# Patient Record
Sex: Male | Born: 1975 | Race: White | Hispanic: No | Smoking: Current every day smoker
Health system: Southern US, Community
[De-identification: ages and names within clinical notes are randomized; demographics above are authoritative.]

---

## 2015-01-28 ENCOUNTER — Emergency Department (HOSPITAL_COMMUNITY): Payer: Medicaid Other

## 2015-01-28 ENCOUNTER — Emergency Department (HOSPITAL_COMMUNITY)
Admission: EM | Admit: 2015-01-28 | Discharge: 2015-01-29 | Disposition: A | Discharge status: Home / Self Care | Payer: Medicaid Other | Attending: Emergency Medicine | Admitting: Emergency Medicine

## 2015-01-28 ENCOUNTER — Encounter (HOSPITAL_COMMUNITY): Payer: Self-pay | Admitting: Family Medicine

## 2015-01-28 DIAGNOSIS — F17200 Nicotine dependence, unspecified, uncomplicated: Secondary | ICD-10-CM | POA: Diagnosis not present

## 2015-01-28 DIAGNOSIS — R112 Nausea with vomiting, unspecified: Secondary | ICD-10-CM | POA: Insufficient documentation

## 2015-01-28 DIAGNOSIS — R1011 Right upper quadrant pain: Secondary | ICD-10-CM | POA: Diagnosis not present

## 2015-01-28 DIAGNOSIS — R1013 Epigastric pain: Secondary | ICD-10-CM | POA: Insufficient documentation

## 2015-01-28 DIAGNOSIS — K219 Gastro-esophageal reflux disease without esophagitis: Secondary | ICD-10-CM | POA: Insufficient documentation

## 2015-01-28 DIAGNOSIS — R079 Chest pain, unspecified: Secondary | ICD-10-CM

## 2015-01-28 LAB — URINE MICROSCOPIC-ADD ON

## 2015-01-28 LAB — URINALYSIS, ROUTINE W REFLEX MICROSCOPIC
BILIRUBIN URINE: NEGATIVE
Glucose, UA: NEGATIVE mg/dL
KETONES UR: NEGATIVE mg/dL
NITRITE: NEGATIVE
PH: 6.5 (ref 5.0–8.0)
PROTEIN: NEGATIVE mg/dL
Specific Gravity, Urine: 1.012 (ref 1.005–1.030)

## 2015-01-28 LAB — COMPREHENSIVE METABOLIC PANEL
ALBUMIN: 3.5 g/dL (ref 3.5–5.0)
ALK PHOS: 103 U/L (ref 38–126)
ALT: 66 U/L — AB (ref 17–63)
AST: 47 U/L — AB (ref 15–41)
Anion gap: 10 (ref 5–15)
BILIRUBIN TOTAL: 0.8 mg/dL (ref 0.3–1.2)
BUN: 8 mg/dL (ref 6–20)
CALCIUM: 9.6 mg/dL (ref 8.9–10.3)
CO2: 24 mmol/L (ref 22–32)
CREATININE: 0.81 mg/dL (ref 0.61–1.24)
Chloride: 107 mmol/L (ref 101–111)
GFR calc Af Amer: >60 mL/min (ref >60)
GLUCOSE: 109 mg/dL — AB (ref 65–99)
POTASSIUM: 4 mmol/L (ref 3.5–5.1)
Sodium: 141 mmol/L (ref 135–145)
TOTAL PROTEIN: 6.7 g/dL (ref 6.5–8.1)

## 2015-01-28 LAB — CBC
HEMATOCRIT: 45.2 % (ref 39.0–52.0)
Hemoglobin: 15.2 g/dL (ref 13.0–17.0)
MCH: 29 pg (ref 26.0–34.0)
MCHC: 33.6 g/dL (ref 30.0–36.0)
MCV: 86.3 fL (ref 78.0–100.0)
PLATELETS: 248 10*3/uL (ref 150–400)
RBC: 5.24 MIL/uL (ref 4.22–5.81)
RDW: 13.7 % (ref 11.5–15.5)
WBC: 10 10*3/uL (ref 4.0–10.5)

## 2015-01-28 LAB — I-STAT TROPONIN, ED: Troponin i, poc: 0 ng/mL (ref 0.00–0.08)

## 2015-01-28 LAB — LIPASE, BLOOD: Lipase: 21 U/L (ref 11–51)

## 2015-01-28 MED ORDER — PANTOPRAZOLE SODIUM 40 MG IV SOLR
40.0000 mg | Freq: Once | INTRAVENOUS | Status: AC
  Administered 2015-01-28: 40 mg via INTRAVENOUS
  Filled 2015-01-28: qty 40

## 2015-01-28 MED ORDER — ONDANSETRON HCL 4 MG/2ML IJ SOLN
4.0000 mg | Freq: Once | INTRAMUSCULAR | Status: AC
  Administered 2015-01-28: 4 mg via INTRAVENOUS
  Filled 2015-01-28: qty 2

## 2015-01-28 MED ORDER — ONDANSETRON 4 MG PO TBDP
ORAL_TABLET | ORAL | Status: AC
  Administered 2015-01-28: 4 mg via ORAL
  Filled 2015-01-28: qty 1

## 2015-01-28 MED ORDER — ONDANSETRON 4 MG PO TBDP
4.0000 mg | ORAL_TABLET | Freq: Once | ORAL | Status: AC | PRN
  Administered 2015-01-28: 4 mg via ORAL

## 2015-01-28 MED ORDER — GI COCKTAIL ~~LOC~~
30.0000 mL | Freq: Once | ORAL | Status: AC
  Administered 2015-01-28: 30 mL via ORAL
  Filled 2015-01-28: qty 30

## 2015-01-28 NOTE — ED Provider Notes (Signed)
CSN: [id number]     Arrival date & time 01/28/15  1522 History   First MD Initiated Contact with Patient 01/28/15 2142     Chief Complaint  Patient presents with  . Chest Pain  . Emesis     (Consider location/radiation/quality/duration/timing/severity/associated sxs/prior Treatment) HPI   Patient is a 40 year old male with past medical history of GERD who presents the ED with complaint of epigastric pain, onset 3 days. Patient reports having waxing and waning sharp and dull pain to his epigastric region. He notes pain is worse with eating and he feels bloated postprandial. Patient states pain is worsened with taking a deep breath. Endorses associated nausea, vomiting. He states he took Pepto-Bismol at home without relief. Patient also notes he was seen at [facility] ED yesterday for the same symptoms, labs EKG chest x-ray and abdominal ultrasound were negative and patient was discharged home and advised to follow-up with his PCP but notes he was unable to schedule an appointment for the next week. Denies fever, chills, shortness of breath, chest pain, cough, palpitations, wheezing, hematemesis, diarrhea, urinary symptoms. Denies history of abdominal surgeries.   History reviewed. No pertinent past medical history. History reviewed. No pertinent past surgical history. History reviewed. No pertinent family history. Social History  Substance Use Topics  . Smoking status: Current Every Day Smoker  . Smokeless tobacco: None  . Alcohol Use: None    Review of Systems  Gastrointestinal: Positive for nausea, vomiting and abdominal pain.  All other systems reviewed and are negative.     Allergies  Codeine  Home Medications   Prior to Admission medications   Medication Sig Start Date End Date Taking? Authorizing Provider  pantoprazole (PROTONIX) 20 MG tablet Take 1 tablet (20 mg total) by mouth daily. 01/29/15   Barrett Henle, PA-C  sucralfate (CARAFATE) 1 GM/10ML suspension Take  10 mLs (1 g total) by mouth 4 (four) times daily -  with meals and at bedtime. 01/29/15   Satira Sark Alzena Gerber, PA-C   BP 126/92 mmHg  Pulse 77  Temp(Src) 98.3 F (36.8 C) (Oral)  Resp 14  SpO2 99% Physical Exam  Constitutional: He is oriented to person, place, and time. He appears well-developed and well-nourished.  HENT:  Head: Normocephalic and atraumatic.  Mouth/Throat: Oropharynx is clear and moist. No oropharyngeal exudate.  Eyes: Conjunctivae and EOM are normal. Right eye exhibits no discharge. Left eye exhibits no discharge. No scleral icterus.  Neck: Normal range of motion. Neck supple.  Cardiovascular: Normal rate, regular rhythm, normal heart sounds and intact distal pulses.   Pulmonary/Chest: Effort normal and breath sounds normal. No respiratory distress. He has no wheezes. He has no rales. He exhibits no tenderness.  Abdominal: Soft. Bowel sounds are normal. He exhibits no distension and no mass. There is tenderness (epigastric and RUQ). There is no rebound and no guarding.  Musculoskeletal: Normal range of motion. He exhibits no edema.  Lymphadenopathy:    He has no cervical adenopathy.  Neurological: He is alert and oriented to person, place, and time.  Skin: Skin is warm and dry.  Nursing note and vitals reviewed.   ED Course  Procedures (including critical care time) Labs Review Labs Reviewed  COMPREHENSIVE METABOLIC PANEL - Abnormal; Notable for the following:    Glucose, Bld 109 (*)    AST 47 (*)    ALT 66 (*)    All other components within normal limits  URINALYSIS, ROUTINE W REFLEX MICROSCOPIC (NOT AT [facility]) - Abnormal; Notable for  the following:    Hgb urine dipstick MODERATE (*)    Leukocytes, UA TRACE (*)    All other components within normal limits  URINE MICROSCOPIC-ADD ON - Abnormal; Notable for the following:    Squamous Epithelial / LPF 0-5 (*)    Bacteria, UA RARE (*)    All other components within normal limits  LIPASE, BLOOD  CBC  I-STAT  TROPOININ, ED  Rosezena SensorI-STAT TROPOININ, ED    Imaging Review Dg Chest 2 View  01/28/2015  CLINICAL DATA:  Chest pain. EXAM: CHEST  2 VIEW COMPARISON:  None. FINDINGS: The heart size and mediastinal contours are within normal limits. Both lungs are clear. The visualized skeletal structures are unremarkable. IMPRESSION: No active cardiopulmonary disease. Electronically Signed   By: Francene BoyersJames  Maxwell M.D.   On: 01/28/2015 16:41   I have personally reviewed and evaluated these images and lab results as part of my medical decision-making.   EKG Interpretation None      MDM   Final diagnoses:  Epigastric pain    Patient presents with epigastric pain and associated nausea/vomiting. History of GERD. [facility] and reports negative workup, diagnosis with GERD and discharged home. Patient states symptoms worsen today. VSS. Exam revealed tenderness to epigastric and right upper quadrant, no peritoneal signs. Patient given antiemetics, Protonix and GI cocktail. EKG showed sinus rhythm, troponin negative. Labs unremarkable. Chest x-ray revealed no acute abnormality. Delta troponin negative. I have a low suspicion for ACS, PE, dissection, or other acute cardiac event at this time. Patient reports his pain improved after PPI and GI cocktail. Patient able to tolerate by mouth. [facility]. I reviewed his records which showed negative chest x-ray, right upper quadrant abdominal ultrasound revealed no acute abnormality, no gallstones or wall thickening visualized, diffuse fatty infiltration within the liver. Due to patient presenting with similar symptoms in having most recent ultrasound performed yesterday do not feel that any further imaging is warranted at this time. I suspect pt's sxs are likely due to GERD. Plan to d/c pt home with PPI. Discussed diet and other nonmedical treatments with pt. advised patient to follow up with his PCP in 2 days. Patient  given GI follow-up.  Evaluation does not show pathology requring ongoing emergent intervention or admission. Pt is hemodynamically stable and mentating appropriately. Discussed findings/results and plan with patient/guardian, who agrees with plan. All questions answered. Return precautions discussed and outpatient follow up given.       Satira Sarkicole Elizabeth Garden Farms[place]adeau, [place] JerseyPA-C 01/29/15 0120  Loren Raceravid Yelverton, MD 01/31/15 [phone]

## 2015-01-28 NOTE — ED Notes (Signed)
Pt here for chest pain with vomiting. Seen at [facility] yesterday for the same. St some abd pain also.

## 2015-01-29 ENCOUNTER — Telehealth (HOSPITAL_COMMUNITY): Payer: Self-pay

## 2015-01-29 LAB — I-STAT TROPONIN, ED: TROPONIN I, POC: 0 ng/mL (ref 0.00–0.08)

## 2015-01-29 MED ORDER — PANTOPRAZOLE SODIUM 20 MG PO TBEC: 20.0000 mg | DELAYED_RELEASE_TABLET | Freq: Every day | ORAL | Status: AC

## 2015-01-29 MED ORDER — SUCRALFATE 1 GM/10ML PO SUSP: 1.0000 g | Freq: Three times a day (TID) | ORAL | Status: AC

## 2015-01-29 NOTE — Telephone Encounter (Signed)
Pt calling to see if could change Rx for Carafate suspension to Carafate in pill form.  Discussed w/Rob Dahlia ClientBrowning PA okay to change to pill form in same dosage.  Spoke w/RPh Sam @ [facility] [place] [phone] to change to pill from suspension in same dose.  Informed pt has Rx.  LVM for pt informing ok to change to pill to bring paper Rx back to pharmacy as pharmacy has new Rx.

## 2015-01-29 NOTE — ED Notes (Signed)
PO Challenge started

## 2015-01-29 NOTE — Discharge Instructions (Signed)
Take your medications as prescribed. Follow-up with your primary care provider in 2 days. I also recommend following up with gastroenterology within the next week for follow-up. Please return to the Emergency Department if symptoms worsen or new onset of fever, difficulty breathing, chest pain, abdominal pain, vomiting, diarrhea, blood in vomit or stool.    Emergency Department Resource Guide 1) Find a Doctor and Pay Out of Pocket Although you won't have to find out who is covered by your insurance plan, it is a good idea to ask around and get recommendations. You will then need to call the office and see if the doctor you have chosen will accept you as a new patient and what types of options they offer for patients who are self-pay. Some doctors offer discounts or will set up payment plans for their patients who do not have insurance, but you will need to ask so you aren't surprised when you get to your appointment.  2) Contact Your Local Health Department Not all health departments have doctors that can see patients for sick visits, but many do, so it is worth a call to see if yours does. If you don't know where your local health department is, you can check in your phone book. The CDC also has a tool to help you locate your state's health department, and many state websites also have listings of all of their local health departments.  3) Find a Walk-in Clinic If your illness is not likely to be very severe or complicated, you may want to try a walk in clinic. These are popping up all over the country in pharmacies, drugstores, and shopping centers. They're usually staffed by nurse practitioners or physician assistants that have been trained to treat common illnesses and complaints. They're usually fairly quick and inexpensive. However, if you have serious medical issues or chronic medical problems, these are probably not your best option.  No Primary Care Doctor: - Call Health Connect at  [phone]  - they can help you locate a primary care doctor that  accepts your insurance, provides certain services, etc. - Physician Referral Service- [phone]  Chronic Pain Problems: Organization         Address  Phone   Notes  [facility]  [phone] Patients need to be referred by their primary care doctor.   Medication Assistance: Organization         Address  Phone   Notes  [facility] Medication [facility] [address] [place], [place] [address] [phone] --Must be a resident of [place] -- Must have NO insurance coverage whatsoever (no Medicaid/ Medicare, etc.) -- The pt. MUST have a primary care doctor that directs their care regularly and follows them in the community   MedAssist  [phone]) [phone]   [employer]  [phone]    Agencies that provide inexpensive medical care: Organization         Address  Phone   Notes  [facility]  [phone]) [phone]   [facility]    [phone]) [phone]   [facility] [address] [place], [place] [address] [phone]   [facility] [place] [address] [place], [place] [phone]   Planned Parenthood    [phone](336) [phone]   [facility]    [phone]) [phone]   Community Health and [facility]  [address], [place] Phone:  [phone], Fax:  [phone](336) [phone]  Hours of Operation:  9 am - 6 pm, M-F.  Also accepts Medicaid/Medicare and self-pay.  [facility]  [address], [place] Phone: [phone](336) [phone], Fax: [phone]) [phone]. Hours of Operation:  8:30 am - 5:30 pm, M-F.  Also accepts Medicaid and self-pay.  [facility] [place] [address], [place] Phone: [phone]) [phone]   [facility] [address], [place], [place] [phone], Ext. [phone] Mondays & Thursdays: 7-9 AM.  First 15 patients are seen on a first come, first serve basis.     Medicaid-accepting [facility] Providers:  Organization         Address  Phone   Notes  [facility] [address], [place] [phone]) [phone] Also accepts self-pay patients.  [facility] [address], [place]  [phone]) [phone]   [facility] [address], [place] [phone]   [facility] [address] [place] [address], [place] [phone]) [phone]   Renaye RakersVeita Bland [address], [place]   [phone] Only accepts [place] Access [place] patients after they have their name applied to their card.   Self-Pay (no insurance) in [facility]:  Organization         Address  Phone   Notes  Sickle Cell Patients, [facility] [address], [place] [phone]) [phone]   [facility] [address], [place] [phone]   [facility] [place]  [address], [place] [phone]   [facility]/Dr. Osei-Bonsu  [address] [place] [address], [place] or [phone], [place] [phone] Phone number for both [place] and [place] locations is the same.  Urgent Medical and [facility] [address], [place] [phone]   [facility] [place] [address] [place] [address], [place] or [address] [phone]) [phone]   [facility] [address], [place] [phone], phone; [phone]) [phone], fax Sees patients 1st and 3rd Saturday of every month.  Must not qualify for public or private insurance (i.e. Medicaid, Medicare, [place] Health Choice, Veterans' Benefits)  Household income should be no more than 200% of the poverty level The clinic cannot treat you if you are pregnant or think you are pregnant  Sexually transmitted diseases are not treated at the clinic.    Dental Care: Organization         Address  Phone  Notes  [facility] [place]  Department of [facility] [address], [place] [phone]) [phone] Accepts children up to age 40 who are enrolled in [place]icaid or [place] Health Choice; pregnant women with a Medicaid card; and children who have applied for Medicaid or [place] Health Choice, but were declined, whose parents can pay a reduced fee at time of service.  [facility] Department of [facility] [place]  [address], [place] [phone]) [phone] Accepts children up to age 40 who are enrolled in [place]icaid or [place] Health Choice; pregnant women with a Medicaid card; and children who have applied for Medicaid or [place] Health Choice, but were declined, whose parents can pay a reduced fee at time of service.  [facility]  [address], [place] [phone] Patients are seen by appointment only. Walk-ins are not accepted. [facility] will  see patients 85 years of age and older. Monday - Tuesday (8am-5pm) Most Wednesdays (8:30-5pm) $30 per visit, cash only  [facility] Adult Dental Access PROGRAM  [address], [facility] [phone] Patients are seen by appointment only. Walk-ins are not accepted. [place] will see patients 66 years of age and older. One Wednesday Evening (Monthly: Volunteer Based).  $30 per visit, cash only  [place]  [phone] for adults; Children under age 39, call Graduate Pediatric Dentistry at [phone]. Children aged 45-14, please call [phone] to request a pediatric application.  Dental services are provided in all areas of dental care including fillings, crowns and bridges, complete and partial dentures, implants, gum treatment, root canals, and extractions. Preventive care is also provided. Treatment is provided to both adults and children. Patients are selected via a lottery and there is often a waiting list.   [facility] [address] [place]  [phone]  [email or web address]   [facility] [address] [place], [place] [phone], Ext. [phone] Second and Fourth Thursday of each month, opens at 6:30 AM; Clinic ends at 9 AM.  Patients are seen on a first-come first-served basis, and a limited number are seen during each clinic.   [facility]  [address] Hillard Danker [place], [place] [phone]   Eligibility Requirements You must have lived in [place], [place], or [place] counties for at least the last three months.   You cannot be eligible for state or federal sponsored [employer], including [employer], [place], or [employer].   You generally cannot be eligible for healthcare insurance through your employer.    How to apply: Eligibility screenings are held every Tuesday and Wednesday afternoon from 1:00 pm until 4:00 pm. You do not need an appointment for the interview!  [facility] [address], [place], [place]   [place]  [place] Department  [place]  [phone]    Behavioral Health Resources in the Community: Intensive Outpatient Programs Organization         Address  Phone  Notes  [place]. [address] [place], [place] [phone]   [facility] [address] [place], [place]   ADS: Alcohol & Drug Svcs [address], [place], [place]   [place] [address],  [place], [place] or [phone]   Substance Abuse Resources Organization         Address  Phone  Notes  Alcohol and Drug Services  [phone]   [place]  [phone]   The [place]   Chinita Pester  [phone]   Residential & Outpatient Substance Abuse Program  [phone]   Psychological Services Organization          Address  Phone  Notes  [facility] [place]  [place]  [phone]   [place] [address], [place] or [phone]    Mobile Crisis Teams Organization         Address  Phone  Notes  [facility]rapeutic [facility]atives, Mobile Crisis Care Unit  [phone]   Assertive Psychotherapeutic Services  [address] [place], [place]   [facility] [address] [place] [phone]    Self-Help/Support Groups Organization  Address  Phone             Notes  Mental Health Assoc. of [place] - variety of support groups  [phone] Call for more information  Narcotics Anonymous (NA), Caring [facility] [address], [place]  2 meetings at this location   Statisticianesidential Treatment Programs Organization         Address  Phone  Notes  [facility] [address],    [place]  [phone]   [facility]  [address], [place] [phone], [place], [place] [phone]   [facility] [address], [place] [phone] Admissions: 8am-3pm M-F  [facility] [address]    [place], [place] [phone]   [facility] [address], [place], [place] [phone]   [facility] [address]  [place], [place] [phone]   [facility] - Intensive Outpatient [address], [place], [place] [phone]   [facility] ([facility] [address]  [place], [place] [phone] or [phone]   [facility] (RTS) [address] [place], [place] [phone] Accepts Medicaid  [facility] [address]  [place] [phone] Substance Abuse/Addiction Treatment   [facility] Behavioral Health Resources Organization         Address  Phone  Notes  [facility]  [phone]) [phone]   Angie FavaJulie Brannon, PhD [address], [address] [place], [place]   [phone]) [phone] or [phone]) [phone]   [facility]   [address] [place], [place] [phone](336) [phone]   [facility] [address], [place], [place] [phone]) [phone] Insurance/Medicaid/sponsorship through [facility]  Faith and Families [address]                                    [place], [place] [phone]) [phone] Therapy/tele-psych/case  [facility] [address]   [place], [place] [phone]) [phone]    Dr. Lolly MustacheArfeen  [phone]) [phone]   [facility] [place]  [employer] [facility] 1) [address], [place] 2) [address], [place] 3)  [address], [place] [phone](336) [phone]) [phone]  [phone]) [phone]   [facility] Child Abuse Hotline [phone]) [phone] or [phone](336) [phone] (After Hours)

## 2016-09-17 IMAGING — DX DG CHEST 2V
2 series · 2 of 2 positions shown · non-contrast
Comparison: None.

CLINICAL DATA: Chest pain.

EXAM:
CHEST  2 VIEW

[chest pa]
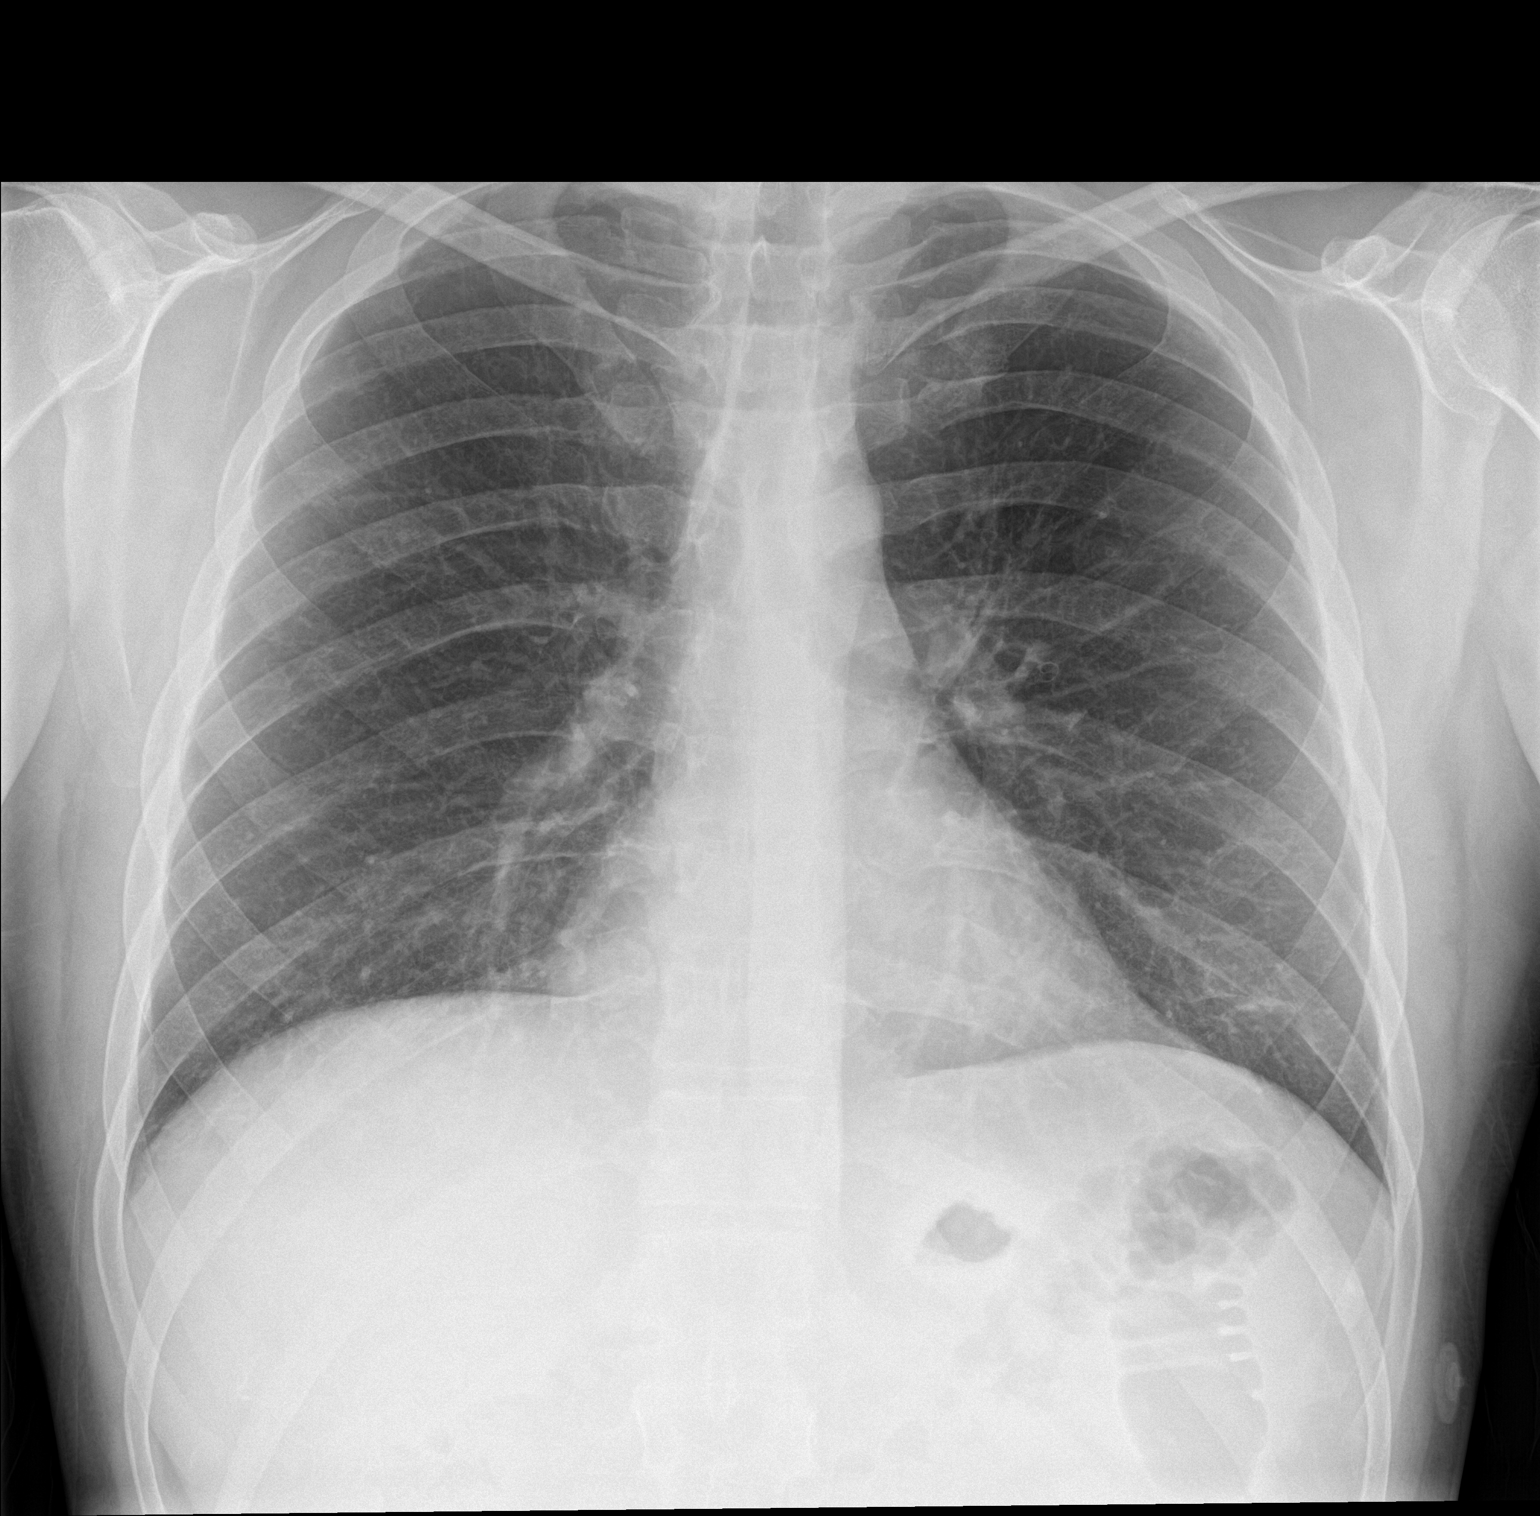

[chest lat]
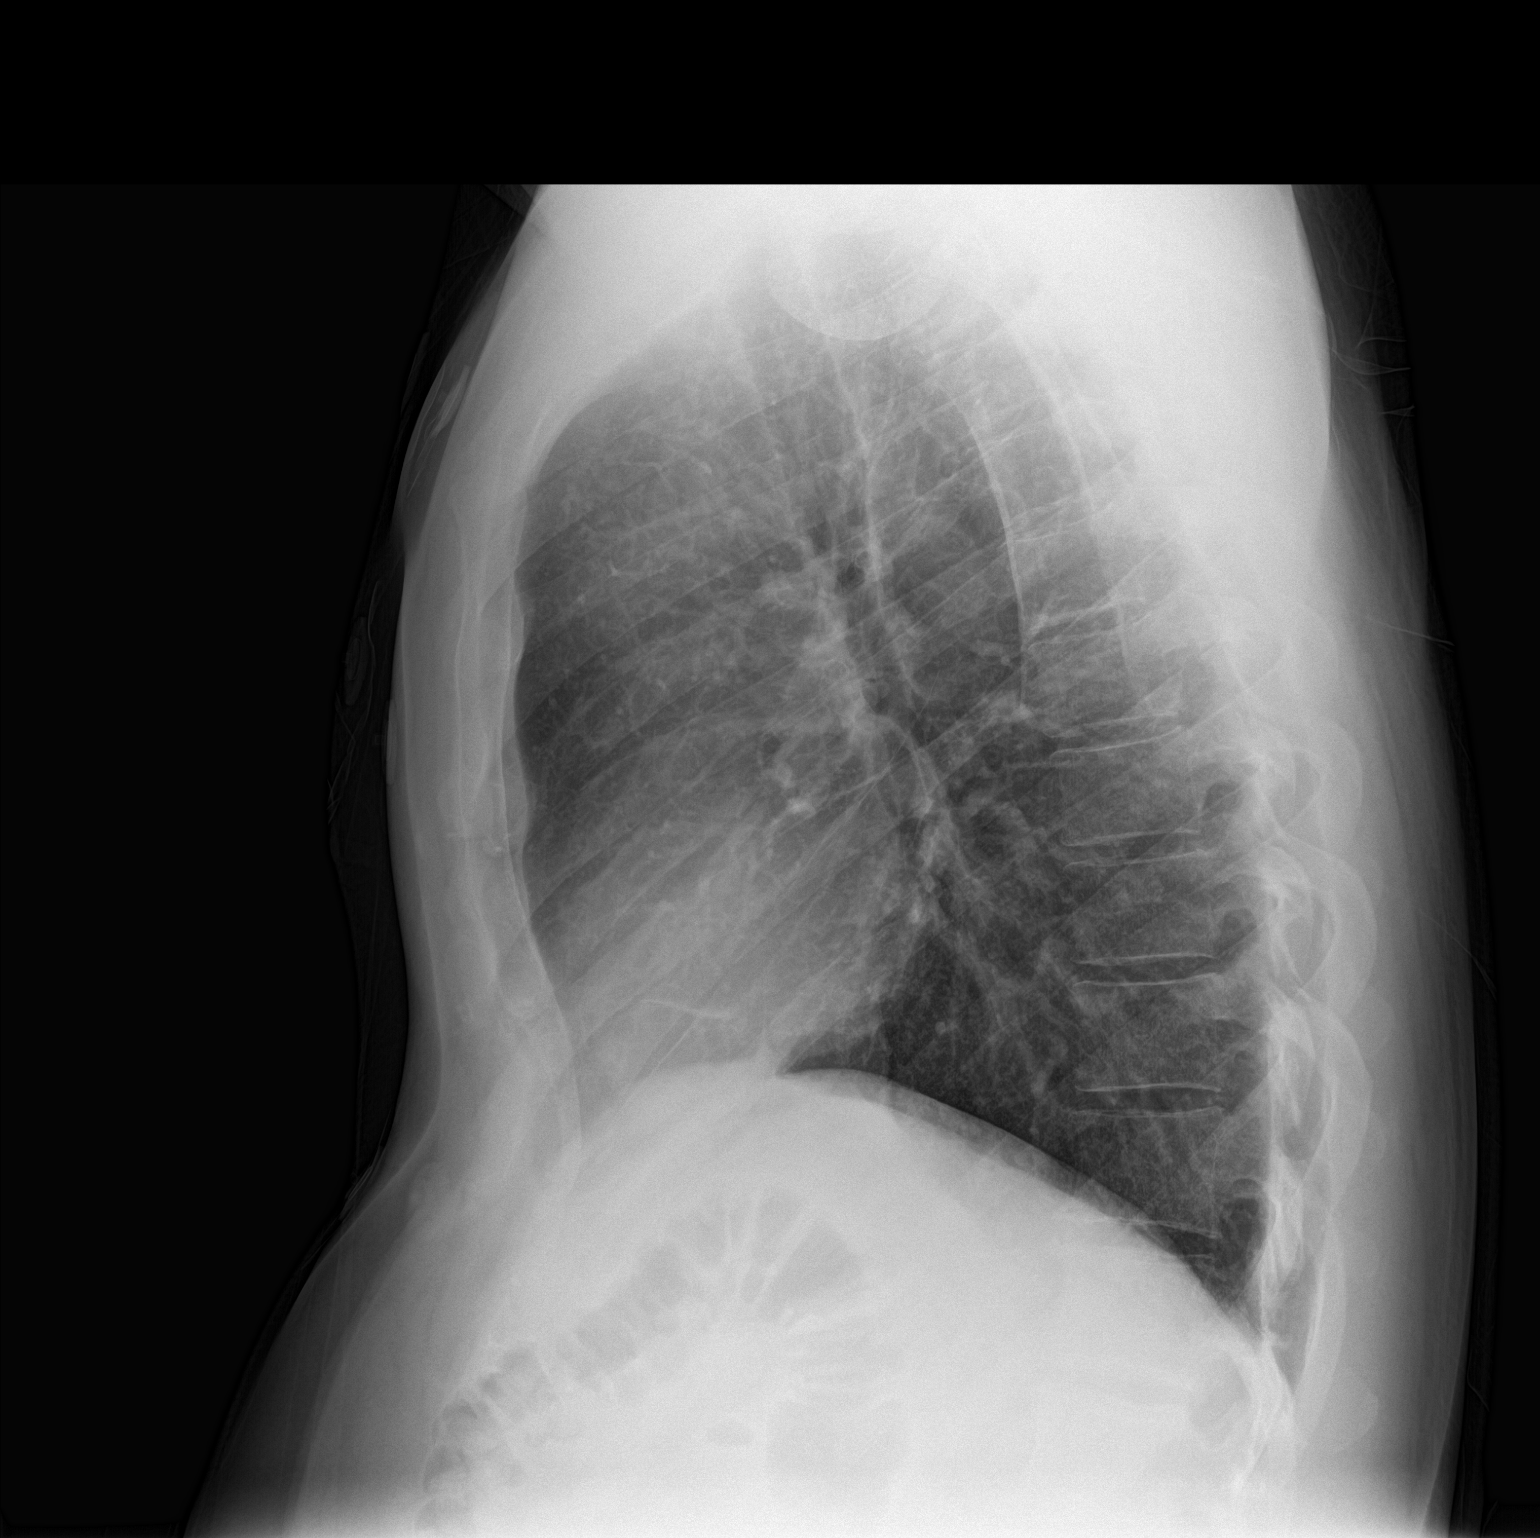

[2 of 2 positions shown; findings below may reference images not displayed]

FINDINGS: The heart size and mediastinal contours are within normal limits.
Both lungs are clear. The visualized skeletal structures are
unremarkable.
IMPRESSION: No active cardiopulmonary disease.

## 2021-09-10 DIAGNOSIS — R079 Chest pain, unspecified: Secondary | ICD-10-CM | POA: Diagnosis not present
